# Patient Record
Sex: Female | Born: 1960 | Race: White | Hispanic: No | Marital: Married
Health system: Southern US, Community
[De-identification: ages and names within clinical notes are randomized; demographics above are authoritative.]

---

## 2013-01-16 ENCOUNTER — Ambulatory Visit: Payer: Self-pay | Admitting: Bariatrics

## 2013-01-16 LAB — COMPREHENSIVE METABOLIC PANEL
Albumin: 3.8 g/dL (ref 3.4–5.0)
Alkaline Phosphatase: 71 U/L (ref 50–136)
Anion Gap: 9 (ref 7–16)
BUN: 15 mg/dL (ref 7–18)
Calcium, Total: 8.9 mg/dL (ref 8.5–10.1)
Chloride: 105 mmol/L (ref 98–107)
Creatinine: 0.77 mg/dL (ref 0.60–1.30)
EGFR (Non-African Amer.): >60
Glucose: 94 mg/dL (ref 65–99)
Osmolality: 282 (ref 275–301)
Potassium: 3.9 mmol/L (ref 3.5–5.1)
SGOT(AST): 20 U/L (ref 15–37)
SGPT (ALT): 27 U/L (ref 12–78)

## 2013-01-16 LAB — FERRITIN: Ferritin (ARMC): 164 ng/mL (ref 8–388)

## 2013-01-16 LAB — CBC WITH DIFFERENTIAL/PLATELET
Basophil #: 0 10*3/uL (ref 0.0–0.1)
Basophil %: 0.4 %
Eosinophil #: 0.1 10*3/uL (ref 0.0–0.7)
Eosinophil %: 0.8 %
HCT: 39.9 % (ref 35.0–47.0)
HGB: 13.3 g/dL (ref 12.0–16.0)
Lymphocyte #: 3.2 10*3/uL (ref 1.0–3.6)
MCHC: 33.4 g/dL (ref 32.0–36.0)
Monocyte #: 0.6 x10 3/mm (ref 0.2–0.9)
Neutrophil #: 8.7 10*3/uL — ABNORMAL HIGH (ref 1.4–6.5)
RDW: 13.3 % (ref 11.5–14.5)
WBC: 12.6 10*3/uL — ABNORMAL HIGH (ref 3.6–11.0)

## 2013-01-16 LAB — IRON AND TIBC
Iron Bind.Cap.(Total): 440 ug/dL (ref 250–450)
Iron Saturation: 17 %
Iron: 73 ug/dL (ref 50–170)
Unbound Iron-Bind.Cap.: 367 ug/dL

## 2013-01-16 LAB — FOLATE: Folic Acid: 17.9 ng/mL (ref 3.1–100.0)

## 2013-01-16 LAB — TSH: Thyroid Stimulating Horm: 0.506 u[IU]/mL

## 2013-01-16 LAB — PROTIME-INR
INR: 0.9
Prothrombin Time: 12.3 secs (ref 11.5–14.7)

## 2013-01-16 LAB — HEMOGLOBIN A1C: Hemoglobin A1C: 5.5 % (ref 4.2–6.3)

## 2013-01-16 LAB — LIPASE, BLOOD: Lipase: 115 U/L (ref 73–393)

## 2013-01-22 ENCOUNTER — Ambulatory Visit: Payer: Self-pay | Admitting: Bariatrics

## 2013-01-27 ENCOUNTER — Ambulatory Visit: Payer: Self-pay | Admitting: Bariatrics

## 2013-06-09 ENCOUNTER — Ambulatory Visit: Payer: Self-pay | Admitting: Bariatrics

## 2013-06-16 ENCOUNTER — Inpatient Hospital Stay: Payer: Self-pay | Admitting: Bariatrics

## 2013-06-16 LAB — CREATININE, SERUM: EGFR (African American): >60

## 2013-06-17 LAB — BASIC METABOLIC PANEL
BUN: 9 mg/dL (ref 7–18)
Calcium, Total: 9.1 mg/dL (ref 8.5–10.1)
Chloride: 107 mmol/L (ref 98–107)
EGFR (African American): >60
EGFR (Non-African Amer.): >60
Glucose: 93 mg/dL (ref 65–99)
Osmolality: 278 (ref 275–301)
Potassium: 3.9 mmol/L (ref 3.5–5.1)

## 2013-06-17 LAB — CBC WITH DIFFERENTIAL/PLATELET
Basophil #: 0.1 10*3/uL (ref 0.0–0.1)
Basophil %: 0.4 %
Eosinophil #: 0.1 10*3/uL (ref 0.0–0.7)
HCT: 37.9 % (ref 35.0–47.0)
HGB: 13 g/dL (ref 12.0–16.0)
Lymphocyte #: 3.5 10*3/uL (ref 1.0–3.6)
MCH: 30.4 pg (ref 26.0–34.0)
Neutrophil #: 9.1 10*3/uL — ABNORMAL HIGH (ref 1.4–6.5)
Neutrophil %: 64.4 %
RBC: 4.28 10*6/uL (ref 3.80–5.20)

## 2013-07-10 LAB — PATHOLOGY REPORT

## 2014-03-19 IMAGING — RF DG UGI W/O KUB
7 series · 14 of 22 positions shown · non-contrast
Comparison: None

REASON FOR EXAM: MORBID OBESITY
COMMENTS:

PROCEDURE:     FL  - FL UPPER GI  - January 16, 2013 [DATE]
RESULT:     Indication: Obesity

[Series 2: fluoro_barium 2fps_bw · 0.17mm/px · 2 of 13 frames shown (1 of 7)]
[frame 2/13]
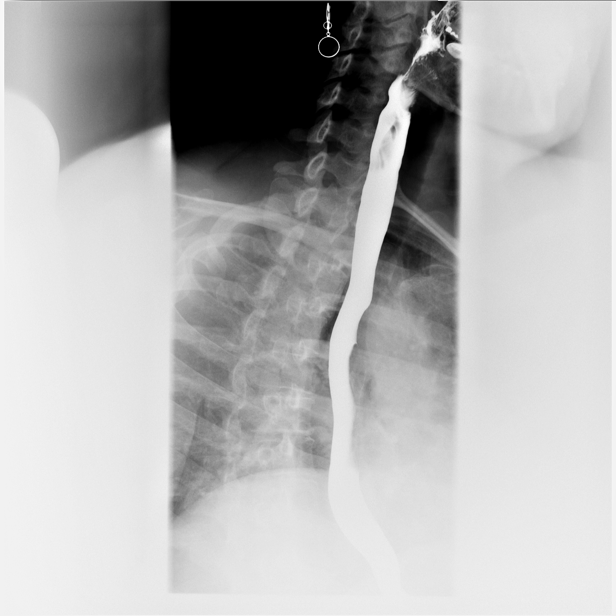
[frame 12/13]
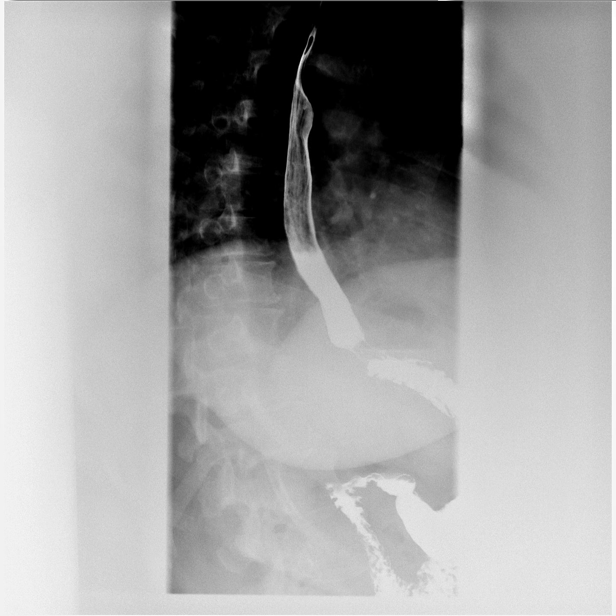

[Series 3: fluoro_barium 2fps_bw · 0.17mm/px · 2 of 32 frames shown (2 of 7)]
[frame 5/32]
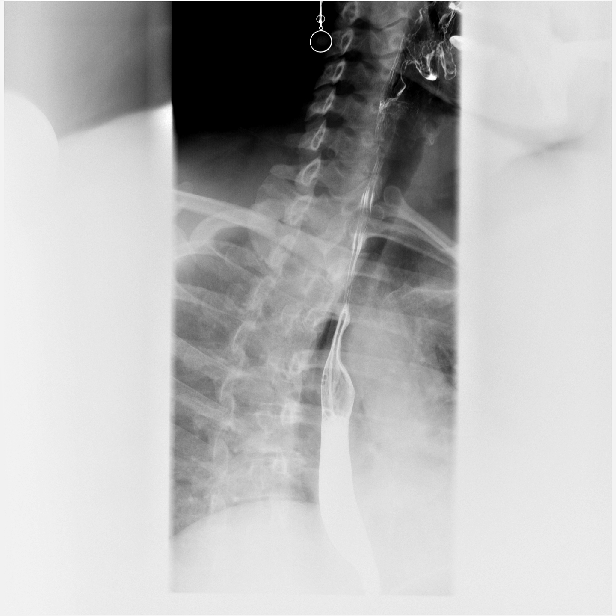
[frame 28/32]
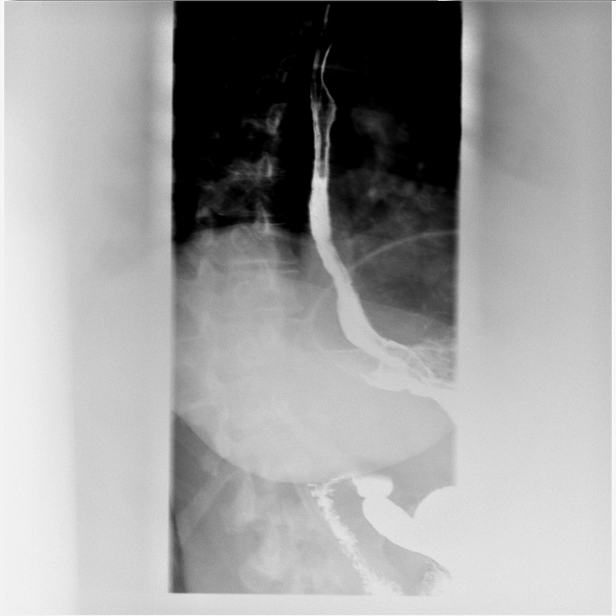

[Series 4: fluoro_barium 2fps_bw · 0.17mm/px · 2 of 19 frames shown (3 of 7)]
[frame 6/19]
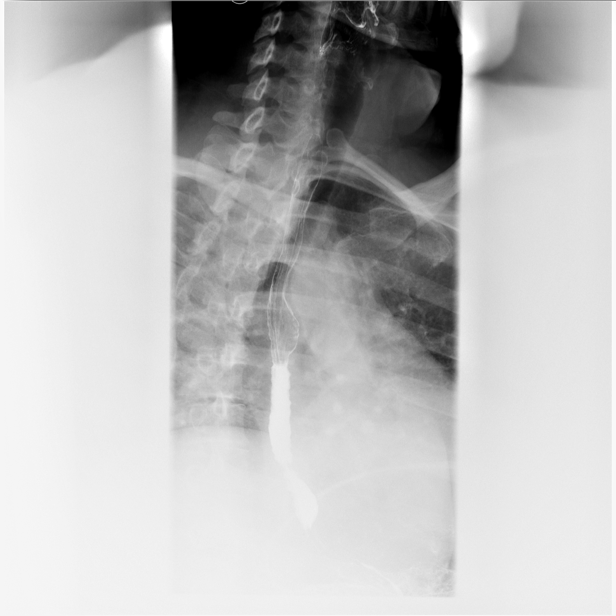
[frame 10/19]
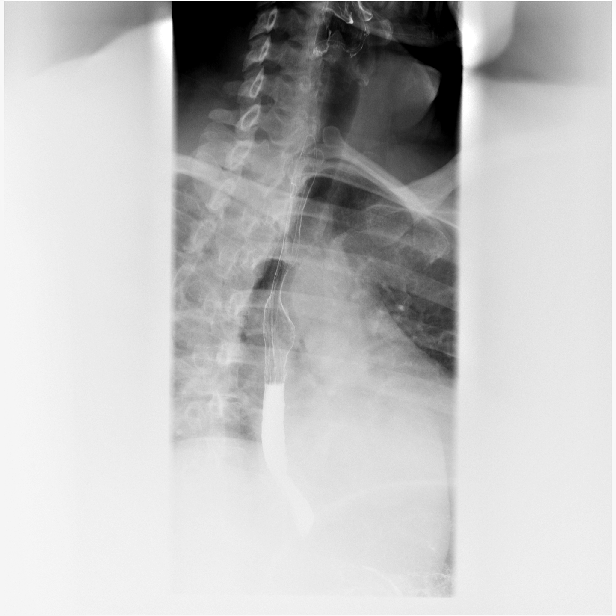

[Series 5: fluoro_barium 2fps_bw · 0.17mm/px · 3 of 40 frames shown (4 of 7)]
[frame 7/40]
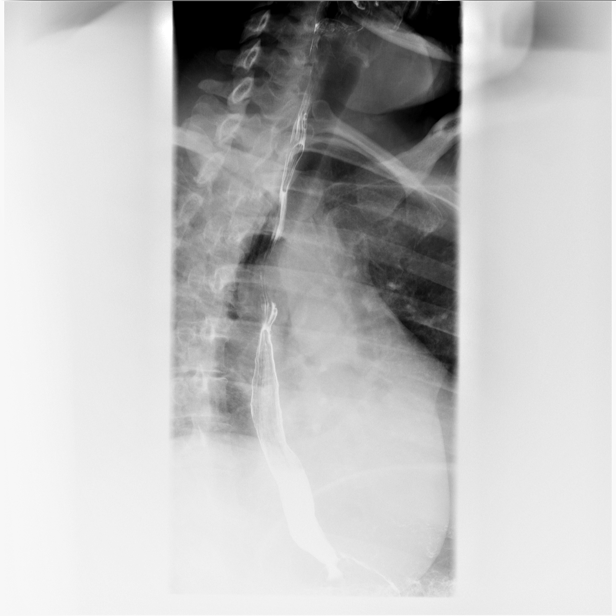
[frame 16/40]
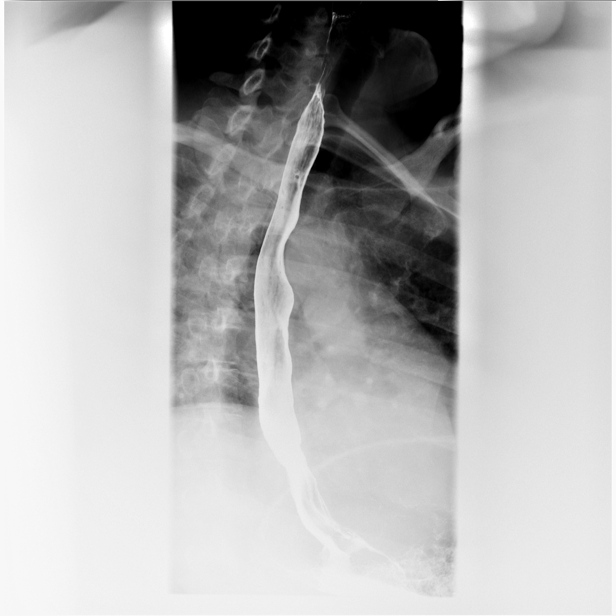
[frame 35/40]
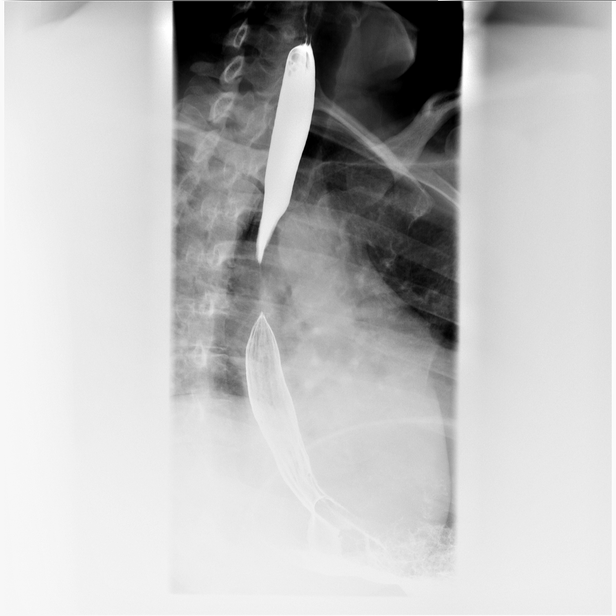

[Series 6: fluoro_barium 2fps_bw · 0.18mm/px · 1 of 2 frames shown (5 of 7)]
[frame 1/2]
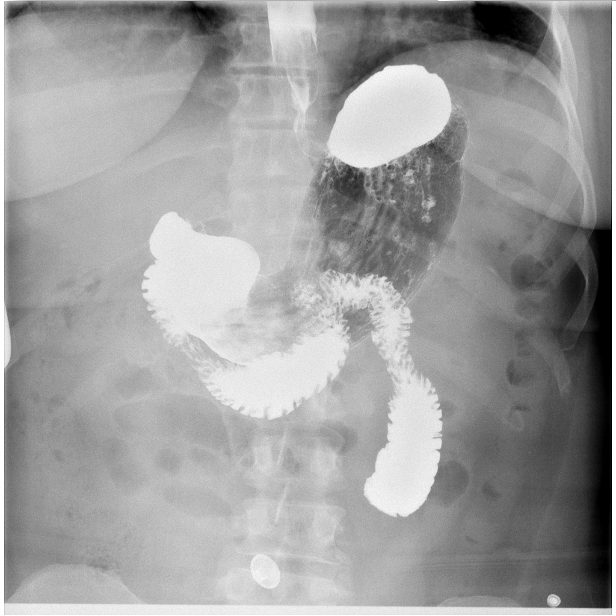

[Series 12: fluoro_barium 2fps_bw · 0.18mm/px · 3 of 38 frames shown (6 of 7)]
[frame 2/38]
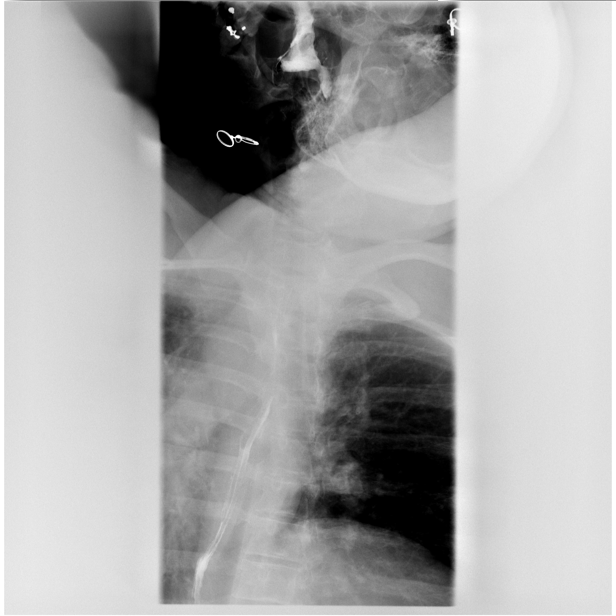
[frame 20/38]
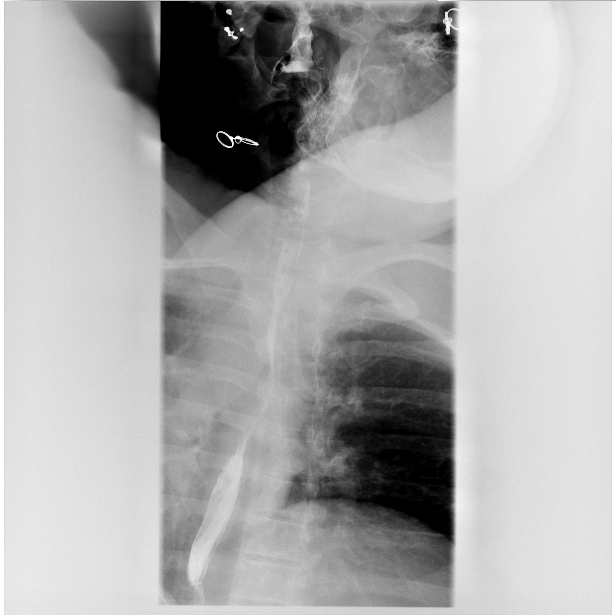
[frame 33/38]
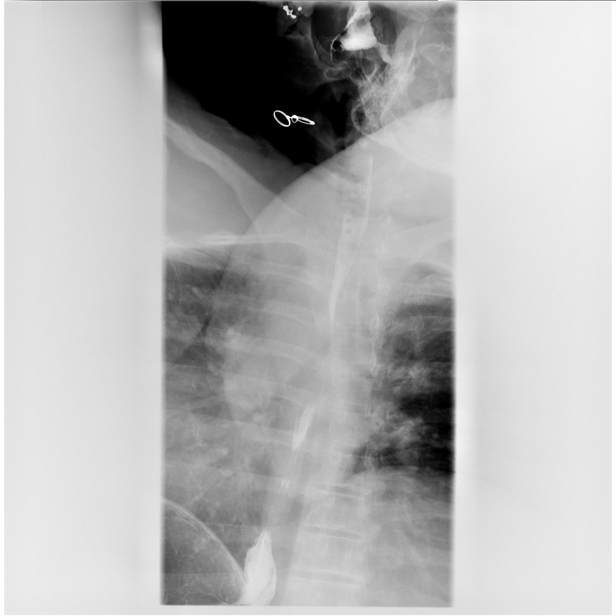

[Series 18: fluoro_barium 2fps_bw · 0.18mm/px · 1 of 2 frames shown (7 of 7)]
[frame 2/2]
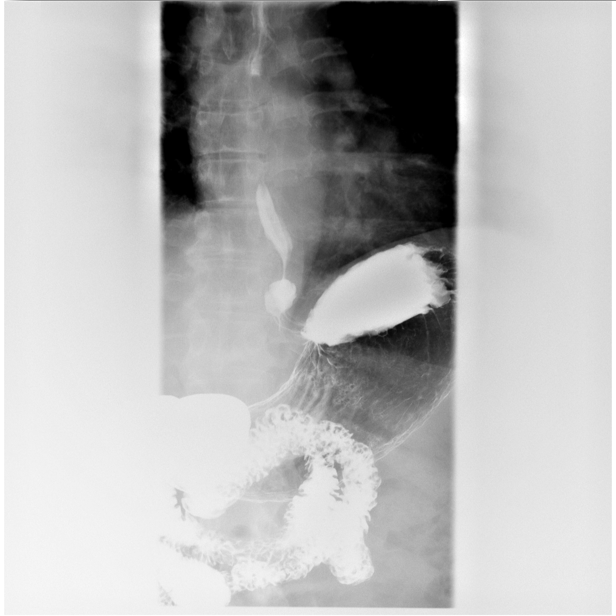

[14 of 22 positions shown; findings below may reference images not displayed]

FINDINGS: Biphasic examination of the esophagus to the distal duodenum was performed
without complication. Total fluoroscopy time was 1.0 minutes.

Examination of the esophagus demonstrated normal esophageal motility. Normal
esophageal morphology without evidence of esophagitis or ulceration. No
esophageal stricture, diverticula, or mass lesion. There is a small hiatal
hernia. There is mild gastroesophageal reflux.

Examination of the stomach demonstrated normal rugal folds and areae
gastricae. The gastric mucosa appeared unremarkable without evidence of
ulceration, scarring, or mass lesion. Gastric motility and emptying was
normal. Fluoroscopic examination of the duodenum demonstrates normal
motility and morphology without evidence of ulceration or mass lesion.
IMPRESSION: 1. There is a small hiatal hernia. There is mild gastroesophageal reflux.

[REDACTED]

## 2015-02-18 NOTE — Op Note (Signed)
PATIENT NAME:  Sherry Mendez, Sherry Mendez MR#:  [id number] DATE OF BIRTH:  [birth year]  DATE OF PROCEDURE:  06/17/2013  PROCEDURE PERFORMED:  Laparoscopic sleeve gastrectomy with repair of hiatal hernia.   PHYSICIANS IN ATTENDANCE:  Effie Shy, M.D.  ASSISTANT:  Sanjuana Kava, PA  PREOPERATIVE DIAGNOSIS:  Morbid obesity with presence of a hiatal hernia.   POSTOPERATIVE DIAGNOSIS:  Morbid obesity with presence of a hiatal hernia.  DESCRIPTION OF PROCEDURE:  The patient was brought to the Operating Room and placed in the supine position.  General anesthesia was obtained with orotracheal intubation.  TED hose and Thromboguards were applied and a foot board applied at the end of the operative bed.  The patient had a prep and drape of the chest and lower abdomen.  An 5 mm Optiview trocar introduced under direct visualization in the left upper quadrant of the abdomen.  Pneumoperitoneum was obtained and three additional trocars introduced across the upper abdomen and a Nathanson liver retractor introduced through a subxiphoid wound.  The patient had elevation of the left lobe of the liver and presence of a hiatal hernia noted.  The patient had a prominent fat pad lying across the region of the upper stomach.  This was freed from the upper stomach by use of a Harmonic scalpel.  This was also then freed from the undersurface of the left hemidiaphragm.  The patient had division of phrenoesophageal ligaments along the lateral aspect of the upper stomach and distal esophagus.  In addition, ligamental attachments of the fundus from the undersurface of the left diaphragm divided.  The patient then had division of portions of the gastrohepatic ligament and the herniated peritoneum was then incised across the anterior hiatus.  Blunt dissection was used to reduce the herniated peritoneum and the lower esophagus away from the overlying pericardium.  The patient had division of the peritoneum just lateral to the right crus and blunt  dissection was then used to reduce herniated lesser sac fatty tissue from the lower mediastinum.  Circumferential dissection of the esophagus was then performed within the lower mediastinum sweeping the esophagus away from the pleural surfaces on the right and the left side, the aorta posteriorly and also the pericardium.  This dissection was extended into the lower mediastinum over a distance of approximately 6 cm.  This resulted in delivery of the distal 2 cm of esophagus into the abdominal cavity, the anterior and posterior vagal nerves being identified and left intact.  The patient then had 2 interrupted 0 Ethibond sutures placed posteriorly to approximate the crus.  Next, the patient had identification of the ligament of Treitz and approximately 4 cm proximal to this the gastric pedicles were divided along the greater curvature of the stomach using the Harmonic scalpel.  This was done over a distance of approximately 6 to 7 cm.  The posterior ligamentus attachments to the pancreas also taken down by use of the Harmonic scalpel.  A 34 French ViSiGi dilator was passed and directed to the level of the antrum.  The stomach was decompressed at this point by way of the ViSiGi device.  Next, a series of GI staplers were used to create a medially based gastric tube paralleling the ViSiGi dilator.  The first firing was a green load stapler placed in a relative transverse direction.  This was then followed by a series of staples placed in a more vertical line paralleling the lesser curvature and ultimately brought out just lateral to the angle of His.  There was  a nice smooth transition between staple firing and staple firing.  The ViSiGi device was then partially withdrawn.  The stomach occluded just proximal of the pylorus and insufflation with the ViSiGi device performed.  There was no evidence of an air leak noted along the staple line using water bath.  At this point, the stomach was decompressed and the ViSiGi  device withdrawn.  The upper divided aspects of the gastrosplenic ligament were then secured to the lateral margins of the sleeve with interrupted 2-0 Ethibond sutures.  This was done in an effort to fixate the stomach within the abdominal cavity and prevent re-herniation into the lower mediastinum.  The lateral stomach delivered as a separate specimen at this time.  This was delivered by way of a 12 mm trocar site in the right upper abdomen.  The fascia and peritoneum of this defect then closed with 0 Vicryl suture as created by way of a needle suture passing system.  The pneumoperitoneum relieved.  Residual trocars removed.  The wounds injected with 0.25% Marcaine followed by 4-0 Monocryl to the dermis followed by Dermabond.  The patient was allowed to recover having tolerated the procedure well with minimal blood loss.     ____________________________ Tyrone AppleMichael A. Alva Garnetyner, MD mat:ea D: 06/17/2013 23:52:59 ET T: 06/18/2013 00:28:32 ET JOB#: [id number]  cc: Casimiro NeedleMichael A. Alva Garnetyner, MD, <Dictator> Mordecai MaesPhillip Singer, MD Everette RankMICHAEL A Takeisha Cianci MD ELECTRONICALLY SIGNED 06/30/2013 11:31

## 2024-03-17 ENCOUNTER — Ambulatory Visit
Admission: RE | Admit: 2024-03-17 | Discharge: 2024-03-17 | Disposition: A | Discharge status: Home / Self Care | Source: Ambulatory Visit | Attending: Family Medicine | Admitting: Family Medicine

## 2024-03-17 ENCOUNTER — Other Ambulatory Visit: Payer: Self-pay | Admitting: Family Medicine

## 2024-03-17 ENCOUNTER — Ambulatory Visit
Admission: RE | Admit: 2024-03-17 | Discharge: 2024-03-17 | Disposition: A | Discharge status: Home / Self Care | Attending: Family Medicine | Admitting: Family Medicine

## 2024-03-17 DIAGNOSIS — M542 Cervicalgia: Secondary | ICD-10-CM

## 2024-03-17 DIAGNOSIS — M79631 Pain in right forearm: Secondary | ICD-10-CM

## 2024-03-18 ENCOUNTER — Encounter: Payer: Self-pay | Admitting: Family Medicine
